# Patient Record
Sex: Female | Born: 1960
Health system: Southern US, Community
[De-identification: ages and names within clinical notes are randomized; demographics above are authoritative.]

## PROBLEM LIST (undated history)

## (undated) DIAGNOSIS — E039 Hypothyroidism, unspecified: Secondary | ICD-10-CM

## (undated) DIAGNOSIS — M7918 Myalgia, other site: Secondary | ICD-10-CM

## (undated) DIAGNOSIS — G43909 Migraine, unspecified, not intractable, without status migrainosus: Secondary | ICD-10-CM

## (undated) HISTORY — PX: LAPAROSCOPIC CHOLECYSTECTOMY: SUR755

## (undated) HISTORY — DX: Myalgia, other site: M79.18

## (undated) HISTORY — DX: Migraine, unspecified, not intractable, without status migrainosus: G43.909

## (undated) HISTORY — DX: Hypothyroidism, unspecified: E03.9

---

## 2018-05-28 DIAGNOSIS — R109 Unspecified abdominal pain: Secondary | ICD-10-CM | POA: Diagnosis not present

## 2018-05-28 DIAGNOSIS — K829 Disease of gallbladder, unspecified: Secondary | ICD-10-CM | POA: Diagnosis not present

## 2018-05-29 DIAGNOSIS — E559 Vitamin D deficiency, unspecified: Secondary | ICD-10-CM | POA: Diagnosis not present

## 2018-05-29 DIAGNOSIS — R1011 Right upper quadrant pain: Secondary | ICD-10-CM | POA: Diagnosis not present

## 2018-05-29 DIAGNOSIS — Z8249 Family history of ischemic heart disease and other diseases of the circulatory system: Secondary | ICD-10-CM | POA: Diagnosis not present

## 2018-05-29 DIAGNOSIS — J45909 Unspecified asthma, uncomplicated: Secondary | ICD-10-CM | POA: Diagnosis not present

## 2018-05-29 DIAGNOSIS — Z808 Family history of malignant neoplasm of other organs or systems: Secondary | ICD-10-CM | POA: Diagnosis not present

## 2018-05-29 DIAGNOSIS — K811 Chronic cholecystitis: Secondary | ICD-10-CM | POA: Diagnosis not present

## 2018-05-29 DIAGNOSIS — E039 Hypothyroidism, unspecified: Secondary | ICD-10-CM | POA: Diagnosis not present

## 2018-05-29 DIAGNOSIS — R1013 Epigastric pain: Secondary | ICD-10-CM | POA: Diagnosis not present

## 2018-05-29 DIAGNOSIS — Z83438 Family history of other disorder of lipoprotein metabolism and other lipidemia: Secondary | ICD-10-CM | POA: Diagnosis not present

## 2018-05-29 DIAGNOSIS — K819 Cholecystitis, unspecified: Secondary | ICD-10-CM | POA: Diagnosis not present

## 2018-05-30 DIAGNOSIS — K810 Acute cholecystitis: Secondary | ICD-10-CM | POA: Diagnosis not present

## 2018-07-02 DIAGNOSIS — G43909 Migraine, unspecified, not intractable, without status migrainosus: Secondary | ICD-10-CM | POA: Diagnosis not present

## 2018-07-16 DIAGNOSIS — M542 Cervicalgia: Secondary | ICD-10-CM | POA: Diagnosis not present

## 2018-07-16 DIAGNOSIS — M7918 Myalgia, other site: Secondary | ICD-10-CM | POA: Diagnosis not present

## 2018-07-16 DIAGNOSIS — R202 Paresthesia of skin: Secondary | ICD-10-CM | POA: Diagnosis not present

## 2018-07-16 DIAGNOSIS — G43709 Chronic migraine without aura, not intractable, without status migrainosus: Secondary | ICD-10-CM | POA: Diagnosis not present

## 2018-07-31 DIAGNOSIS — M7918 Myalgia, other site: Secondary | ICD-10-CM | POA: Diagnosis not present

## 2019-01-02 DIAGNOSIS — M7918 Myalgia, other site: Secondary | ICD-10-CM | POA: Diagnosis not present

## 2019-01-02 DIAGNOSIS — R202 Paresthesia of skin: Secondary | ICD-10-CM | POA: Diagnosis not present

## 2019-01-02 DIAGNOSIS — M542 Cervicalgia: Secondary | ICD-10-CM | POA: Diagnosis not present

## 2019-01-02 DIAGNOSIS — G43709 Chronic migraine without aura, not intractable, without status migrainosus: Secondary | ICD-10-CM | POA: Diagnosis not present

## 2019-01-31 DIAGNOSIS — M7918 Myalgia, other site: Secondary | ICD-10-CM | POA: Diagnosis not present

## 2019-04-02 ENCOUNTER — Ambulatory Visit (INDEPENDENT_AMBULATORY_CARE_PROVIDER_SITE_OTHER): Payer: BC Managed Care – PPO | Admitting: Sports Medicine

## 2019-04-02 ENCOUNTER — Ambulatory Visit (INDEPENDENT_AMBULATORY_CARE_PROVIDER_SITE_OTHER): Payer: BC Managed Care – PPO

## 2019-04-02 ENCOUNTER — Other Ambulatory Visit: Payer: Self-pay

## 2019-04-02 ENCOUNTER — Encounter: Payer: Self-pay | Admitting: Sports Medicine

## 2019-04-02 DIAGNOSIS — M7501 Adhesive capsulitis of right shoulder: Secondary | ICD-10-CM

## 2019-04-02 DIAGNOSIS — M7502 Adhesive capsulitis of left shoulder: Secondary | ICD-10-CM

## 2019-04-02 DIAGNOSIS — M25511 Pain in right shoulder: Secondary | ICD-10-CM

## 2019-04-02 DIAGNOSIS — M25512 Pain in left shoulder: Secondary | ICD-10-CM | POA: Diagnosis not present

## 2019-04-02 NOTE — Progress Notes (Addendum)
Subjective:    CC: Bilateral shoulder pain  HPI:  Hannah Matthews is a very pleasant 58 year old female, for many months now she is had pain in both shoulders with severe loss of range of motion, pain is localized over the deltoids, joint lines, she can only abduct and externally rotate a bit.  Severe, persistent, no radiation.  I reviewed the past medical history, family history, social history, surgical history, and allergies today and no changes were needed.  Please see the problem list section below in epic for further details.  Past Medical History: Past Medical History:  Diagnosis Date  . Hypothyroid   . Migraine   . Myofascial pain    Past Surgical History: Past Surgical History:  Procedure Laterality Date  . LAPAROSCOPIC CHOLECYSTECTOMY     Social History: Social History   Socioeconomic History  . Marital status: Single    Spouse name: Not on file  . Number of children: Not on file  . Years of education: Not on file  . Highest education level: Not on file  Occupational History  . Not on file  Social Needs  . Financial resource strain: Not on file  . Food insecurity    Worry: Not on file    Inability: Not on file  . Transportation needs    Medical: Not on file    Non-medical: Not on file  Tobacco Use  . Smoking status: Never Smoker  . Smokeless tobacco: Never Used  Substance and Sexual Activity  . Alcohol use: Not Currently  . Drug use: Never  . Sexual activity: Not on file  Lifestyle  . Physical activity    Days per week: Not on file    Minutes per session: Not on file  . Stress: Not on file  Relationships  . Social Herbalist on phone: Not on file    Gets together: Not on file    Attends religious service: Not on file    Active member of club or organization: Not on file    Attends meetings of clubs or organizations: Not on file    Relationship status: Not on file  Other Topics Concern  . Not on file  Social History Narrative  . Not on file    Family History: History reviewed. No pertinent family history. Allergies: Allergies  Allergen Reactions  . Shellfish Allergy Anaphylaxis  . Morphine And Related Nausea And Vomiting  . Sulfa Antibiotics Nausea And Vomiting and Rash   Medications: See med rec.  Review of Systems: No headache, visual changes, nausea, vomiting, diarrhea, constipation, dizziness, abdominal pain, skin rash, fevers, chills, night sweats, swollen lymph nodes, weight loss, chest pain, body aches, joint swelling, muscle aches, shortness of breath, mood changes, visual or auditory hallucinations.  Objective:    General: Well Developed, well nourished, and in no acute distress.  Neuro: Alert and oriented x3, extra-ocular muscles intact, sensation grossly intact.  HEENT: Normocephalic, atraumatic, pupils equal round reactive to light, neck supple, no masses, no lymphadenopathy, thyroid nonpalpable.  Skin: Warm and dry, no rashes noted.  Cardiac: Regular rate and rhythm, no murmurs rubs or gallops.  Respiratory: Clear to auscultation bilaterally. Not using accessory muscles, speaking in full sentences.  Abdominal: Soft, nontender, nondistended, positive bowel sounds, no masses, no organomegaly.  Shoulders: External rotation to neutral, abduction to 20 degrees.  All with pain in bilateral.  Procedure: Real-time Ultrasound Guided injection of the left glenohumeral joint Device: GE Logiq E  Verbal informed consent obtained.  Time-out  conducted.  Noted no overlying erythema, induration, or other signs of local infection.  Skin prepped in a sterile fashion.  Local anesthesia: Topical Ethyl chloride.  With sterile technique and under real time ultrasound guidance:  Using a 22-gauge spinal needle advanced from a posterior approach and injected 1 cc Kenalog 40, 2 cc lidocaine, 2 cc bupivacaine Completed without difficulty  Pain immediately resolved suggesting accurate placement of the medication.  Advised to call if  fevers/chills, erythema, induration, drainage, or persistent bleeding.  Images permanently stored and available for review in the ultrasound unit.  Impression: Technically successful ultrasound guided injection.  Procedure: Real-time Ultrasound Guided injection of the right glenohumeral joint Device: GE Logiq E  Verbal informed consent obtained.  Time-out conducted.  Noted no overlying erythema, induration, or other signs of local infection.  Skin prepped in a sterile fashion.  Local anesthesia: Topical Ethyl chloride.  With sterile technique and under real time ultrasound guidance:  Using a 22-gauge spinal needle advanced from a posterior approach and injected 1 cc Kenalog 40, 2 cc lidocaine, 2 cc bupivacaine Completed without difficulty  Pain immediately resolved suggesting accurate placement of the medication.  Advised to call if fevers/chills, erythema, induration, drainage, or persistent bleeding.  Images permanently stored and available for review in the ultrasound unit.  Impression: Technically successful ultrasound guided injection.  Impression and Recommendations:    The patient was counselled, risk factors were discussed, anticipatory guidance given.  Adhesive capsulitis of both shoulders Bilateral glenohumeral injections. Failed greater than 6 weeks of conservative measures. X-rays, MRIs, home rehab. Return to see me in 6 weeks.  Still with some discomfort after the injection, MRI showed a little bit of cuff tendinosis but that is about it. Adding Celebrex, formal PT.   ___________________________________________ Ihor Austin. Benjamin Stain, M.D., ABFM., CAQSM. Primary Care and Sports Medicine [facility]  Adjunct Professor of Family Medicine  [employer]

## 2019-04-02 NOTE — Assessment & Plan Note (Addendum)
Bilateral glenohumeral injections. Failed greater than 6 weeks of conservative measures. X-rays, MRIs, home rehab. Return to see me in 6 weeks.  Still with some discomfort after the injection, MRI showed a little bit of cuff tendinosis but that is about it. Adding Celebrex, formal PT.

## 2019-04-15 ENCOUNTER — Other Ambulatory Visit: Payer: BC Managed Care – PPO

## 2019-04-22 ENCOUNTER — Ambulatory Visit: Payer: BC Managed Care – PPO

## 2019-04-22 ENCOUNTER — Other Ambulatory Visit: Payer: Self-pay

## 2019-04-22 ENCOUNTER — Ambulatory Visit (INDEPENDENT_AMBULATORY_CARE_PROVIDER_SITE_OTHER): Payer: BC Managed Care – PPO

## 2019-04-22 DIAGNOSIS — M19011 Primary osteoarthritis, right shoulder: Secondary | ICD-10-CM

## 2019-04-22 DIAGNOSIS — M19012 Primary osteoarthritis, left shoulder: Secondary | ICD-10-CM

## 2019-04-22 DIAGNOSIS — M25511 Pain in right shoulder: Secondary | ICD-10-CM | POA: Diagnosis not present

## 2019-04-25 MED ORDER — CELECOXIB 200 MG PO CAPS: ORAL_CAPSULE | ORAL | 2 refills | Status: DC

## 2019-04-25 NOTE — Addendum Note (Signed)
Addended by: Silverio Decamp on: 04/25/2019 11:43 AM   Modules accepted: Orders

## 2019-05-15 ENCOUNTER — Encounter: Payer: Self-pay | Admitting: Sports Medicine

## 2019-05-15 ENCOUNTER — Ambulatory Visit (INDEPENDENT_AMBULATORY_CARE_PROVIDER_SITE_OTHER): Payer: BC Managed Care – PPO | Admitting: Sports Medicine

## 2019-05-15 ENCOUNTER — Telehealth: Payer: Self-pay | Admitting: Sports Medicine

## 2019-05-15 ENCOUNTER — Other Ambulatory Visit: Payer: Self-pay

## 2019-05-15 DIAGNOSIS — M7501 Adhesive capsulitis of right shoulder: Secondary | ICD-10-CM | POA: Diagnosis not present

## 2019-05-15 DIAGNOSIS — M7502 Adhesive capsulitis of left shoulder: Secondary | ICD-10-CM | POA: Diagnosis not present

## 2019-05-15 MED ORDER — GABAPENTIN 300 MG PO CAPS: ORAL_CAPSULE | ORAL | 3 refills | Status: DC

## 2019-05-15 NOTE — Addendum Note (Signed)
Addended by: Silverio Decamp on: 05/15/2019 03:35 PM   Modules accepted: Orders

## 2019-05-15 NOTE — Progress Notes (Signed)
Subjective:    CC: Follow-up  HPI: Kendria returns, clinically she had adhesive capsulitis of both shoulders the last visit, she improved to some degree with bilateral glenohumeral joint injections, she never did formal physical therapy and is having recurrence of discomfort.  I reviewed the past medical history, family history, social history, surgical history, and allergies today and no changes were needed.  Please see the problem list section below in epic for further details.  Past Medical History: Past Medical History:  Diagnosis Date  . Hypothyroid   . Migraine   . Myofascial pain    Past Surgical History: Past Surgical History:  Procedure Laterality Date  . LAPAROSCOPIC CHOLECYSTECTOMY     Social History: Social History   Socioeconomic History  . Marital status: Single    Spouse name: Not on file  . Number of children: Not on file  . Years of education: Not on file  . Highest education level: Not on file  Occupational History  . Not on file  Social Needs  . Financial resource strain: Not on file  . Food insecurity    Worry: Not on file    Inability: Not on file  . Transportation needs    Medical: Not on file    Non-medical: Not on file  Tobacco Use  . Smoking status: Never Smoker  . Smokeless tobacco: Never Used  Substance and Sexual Activity  . Alcohol use: Not Currently  . Drug use: Never  . Sexual activity: Not on file  Lifestyle  . Physical activity    Days per week: Not on file    Minutes per session: Not on file  . Stress: Not on file  Relationships  . Social Herbalist on phone: Not on file    Gets together: Not on file    Attends religious service: Not on file    Active member of club or organization: Not on file    Attends meetings of clubs or organizations: Not on file    Relationship status: Not on file  Other Topics Concern  . Not on file  Social History Narrative  . Not on file   Family History: No family history on  file. Allergies: Allergies  Allergen Reactions  . Shellfish Allergy Anaphylaxis  . Morphine And Related Nausea And Vomiting  . Sulfa Antibiotics Nausea And Vomiting and Rash   Medications: See med rec.  Review of Systems: No fevers, chills, night sweats, weight loss, chest pain, or shortness of breath.   Objective:    General: Well Developed, well nourished, and in no acute distress.  Neuro: Alert and oriented x3, extra-ocular muscles intact, sensation grossly intact.  HEENT: Normocephalic, atraumatic, pupils equal round reactive to light, neck supple, no masses, no lymphadenopathy, thyroid nonpalpable.  Skin: Warm and dry, no rashes. Cardiac: Regular rate and rhythm, no murmurs rubs or gallops, no lower extremity edema.  Respiratory: Clear to auscultation bilaterally. Not using accessory muscles, speaking in full sentences.  Impression and Recommendations:    Adhesive capsulitis of both shoulders Partial improvement after bilateral glenohumeral injections the last visit, has not done formal PT. She is going to go back to physical therapy and schedule appointments 3 times weekly. Discontinue Celebrex if she is getting some dyspepsia, switching to gabapentin, there is a history of fibromyalgia. Return to see [place] in 6 weeks, I am happy to repeat injections at that time if needed.   ___________________________________________ Gwen Her. Dianah Field, M.D., ABFM., CAQSM. Primary Care and Sports  Medicine [facility]  Adjunct Professor of [employer]

## 2019-05-15 NOTE — Telephone Encounter (Signed)
PT asked for her physical therapy request to be sent to a new office. Please advise.   [facility] - [place] [address], [place], [place] [address] [phone]

## 2019-05-15 NOTE — Telephone Encounter (Signed)
Orders placed, Jenny Reichmann will work on it.

## 2019-05-15 NOTE — Assessment & Plan Note (Signed)
Partial improvement after bilateral glenohumeral injections the last visit, has not done formal PT. She is going to go back to physical therapy and schedule appointments 3 times weekly. Discontinue Celebrex if she is getting some dyspepsia, switching to gabapentin, there is a history of fibromyalgia. Return to see [place] in 6 weeks, I am happy to repeat injections at that time if needed.

## 2019-05-16 NOTE — Telephone Encounter (Signed)
Done - CF °

## 2019-06-26 ENCOUNTER — Ambulatory Visit: Payer: BC Managed Care – PPO | Admitting: Sports Medicine

## 2019-09-19 ENCOUNTER — Other Ambulatory Visit: Payer: Self-pay | Admitting: Sports Medicine

## 2019-09-19 DIAGNOSIS — M7501 Adhesive capsulitis of right shoulder: Secondary | ICD-10-CM

## 2019-09-19 MED ORDER — GABAPENTIN 300 MG PO CAPS: 300.0000 mg | ORAL_CAPSULE | Freq: Three times a day (TID) | ORAL | 0 refills | Status: DC

## 2019-12-25 ENCOUNTER — Other Ambulatory Visit: Payer: Self-pay | Admitting: Sports Medicine

## 2019-12-25 DIAGNOSIS — M7502 Adhesive capsulitis of left shoulder: Secondary | ICD-10-CM

## 2019-12-25 DIAGNOSIS — M7501 Adhesive capsulitis of right shoulder: Secondary | ICD-10-CM

## 2019-12-25 MED ORDER — GABAPENTIN 300 MG PO CAPS: 300.0000 mg | ORAL_CAPSULE | Freq: Three times a day (TID) | ORAL | 0 refills | Status: DC

## 2020-03-12 IMAGING — MR MR SHOULDER*L* W/O CM
5 series · 40 of 40 positions shown · non-contrast
Comparison: Plain films right shoulder 04/02/2019.

CLINICAL DATA: Right shoulder pain and limited range of motion for
6 months. No known injury.

EXAM:
MRI OF THE LEFT SHOULDER WITHOUT CONTRAST
TECHNIQUE: Multiplanar, multisequence MR imaging of the shoulder was performed.
No intravenous contrast was administered.

[Series 3: PD fat-sat · axial · 4.0mm · 0.59mm/px · z∈[-47,+42]mm · 10 of 22 slices shown (1 of 2)]
[im 1/22]
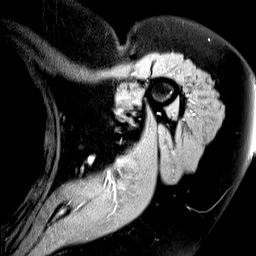
[im 3/22]
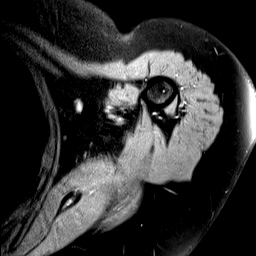
[im 5/22]
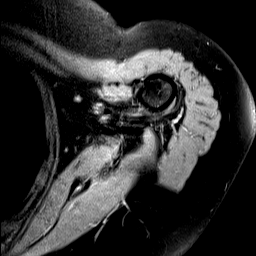
[im 8/22]
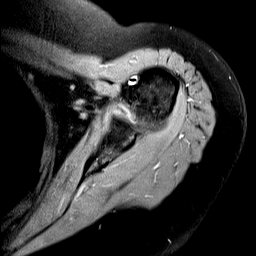
[im 10/22]
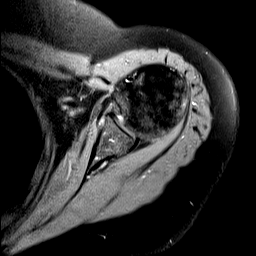
[im 12/22]
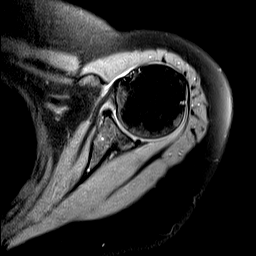
[im 15/22]
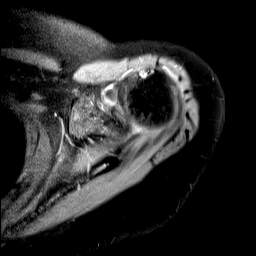
[im 17/22]
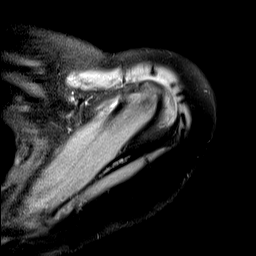
[im 19/22]
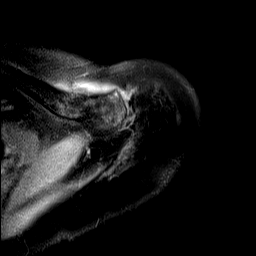
[im 22/22]
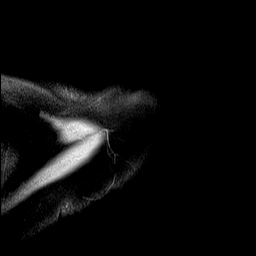

[Series 4: T2 fat-sat · oblique · 4.0mm · 0.59mm/px · 7 of 17 slices shown (1 of 2)]
[im 1/17]
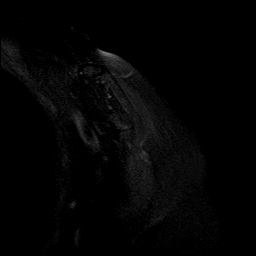
[im 3/17]
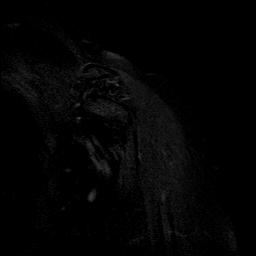
[im 6/17]
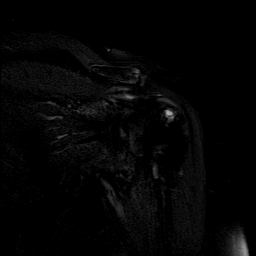
[im 9/17]
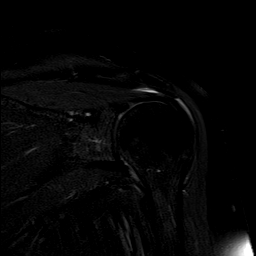
[im 11/17]
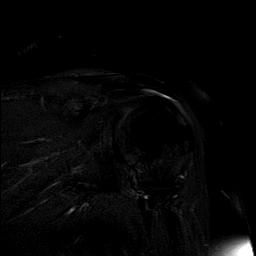
[im 14/17]
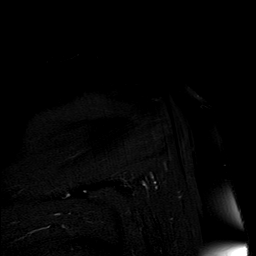
[im 17/17]
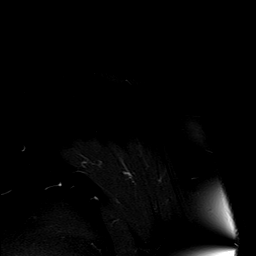

[Series 5: PD fat-sat · oblique · 4.0mm · 0.59mm/px · 7 of 17 slices shown (2 of 2)]
[im 1/17]
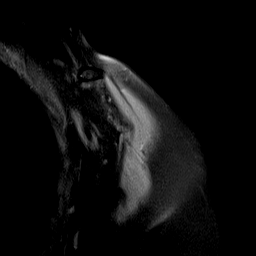
[im 3/17]
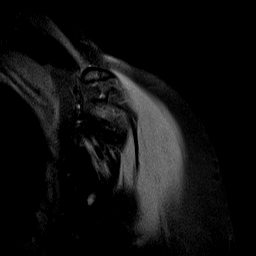
[im 6/17]
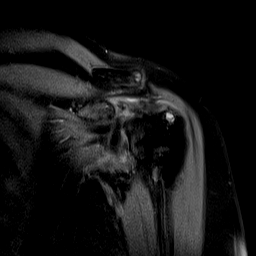
[im 9/17]
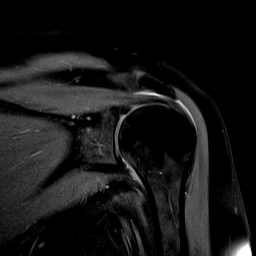
[im 11/17]
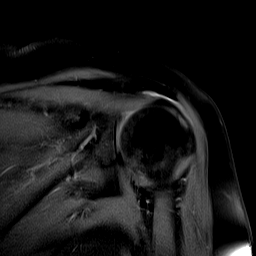
[im 14/17]
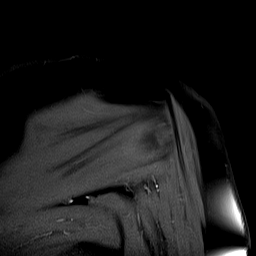
[im 17/17]
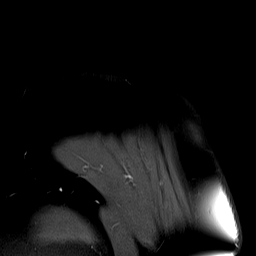

[Series 6: T1 · oblique · 4.0mm · 0.59mm/px · 8 of 20 slices shown]
[im 1/20]
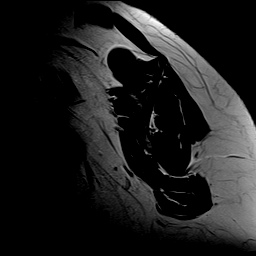
[im 3/20]
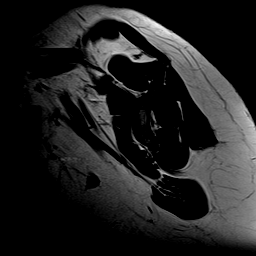
[im 6/20]
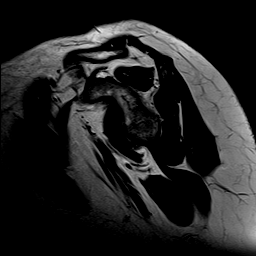
[im 9/20]
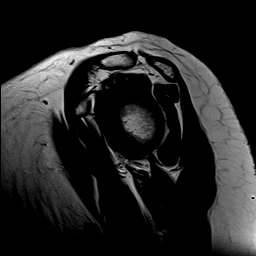
[im 11/20]
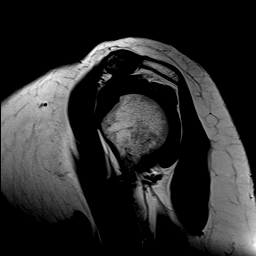
[im 14/20]
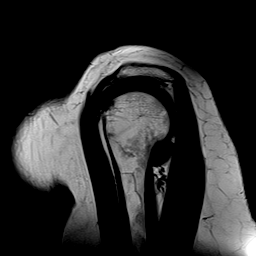
[im 17/20]
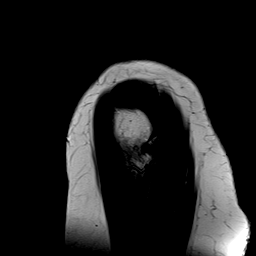
[im 20/20]
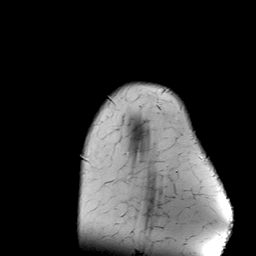

[Series 7: T2 fat-sat · oblique · 4.0mm · 0.59mm/px · 8 of 20 slices shown (2 of 2)]
[im 1/20]
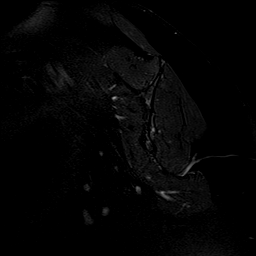
[im 3/20]
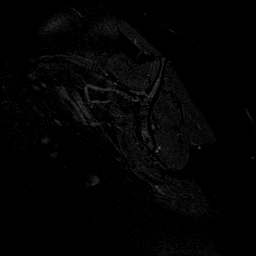
[im 6/20]
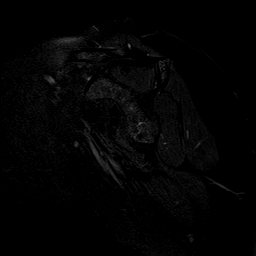
[im 9/20]
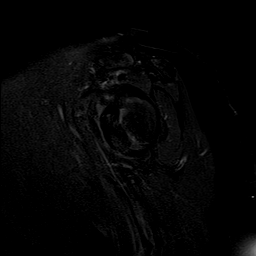
[im 11/20]
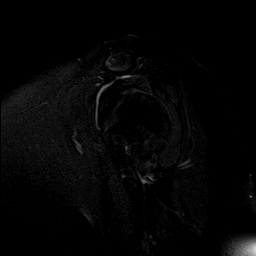
[im 14/20]
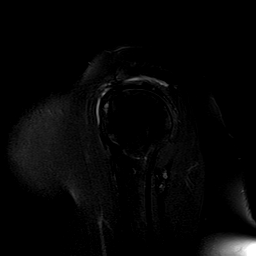
[im 17/20]
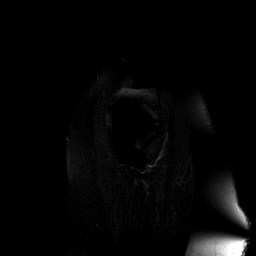
[im 20/20]
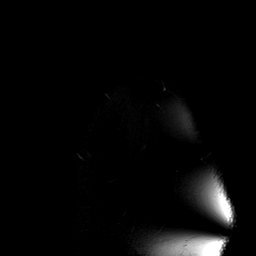

[40 of 40 positions shown; findings below may reference images not displayed]

FINDINGS: Rotator cuff: Mildly increased T2 signal is seen in the
supraspinatus tendon. The rotator cuff is intact.

Muscles:  Normal without atrophy or focal lesion.

Biceps long head:  Intact and normal in appearance.

Acromioclavicular Joint: Normal. Type 2 acromion. A small to
moderate volume of fluid is seen in the subacromial/subdeltoid
bursa. There is some subacromial spurring.

Glenohumeral Joint: Appears normal.

Labrum:  Intact

Bones:  No fracture or worrisome lesion.

Other: None.
IMPRESSION: Fluid in the subacromial/subdeltoid bursa consistent with bursitis.

Very mild appearing supraspinatus tendinopathy without tear.

Type 2 acromion with some subacromial spurring noted.

## 2020-03-27 ENCOUNTER — Other Ambulatory Visit: Payer: Self-pay | Admitting: Sports Medicine

## 2020-03-27 DIAGNOSIS — M7502 Adhesive capsulitis of left shoulder: Secondary | ICD-10-CM

## 2020-07-10 ENCOUNTER — Other Ambulatory Visit: Payer: Self-pay | Admitting: Sports Medicine

## 2020-07-10 DIAGNOSIS — M7501 Adhesive capsulitis of right shoulder: Secondary | ICD-10-CM

## 2020-07-10 DIAGNOSIS — M7502 Adhesive capsulitis of left shoulder: Secondary | ICD-10-CM
